# Patient Record
Sex: Male | Born: 1960 | Race: White | Hispanic: No | Marital: Married | Smoking: Current every day smoker
Health system: Southern US, Community
[De-identification: ages and names within clinical notes are randomized; demographics above are authoritative.]

## PROBLEM LIST (undated history)

## (undated) DIAGNOSIS — M549 Dorsalgia, unspecified: Secondary | ICD-10-CM

## (undated) DIAGNOSIS — J449 Chronic obstructive pulmonary disease, unspecified: Secondary | ICD-10-CM

## (undated) DIAGNOSIS — I10 Essential (primary) hypertension: Secondary | ICD-10-CM

## (undated) DIAGNOSIS — F419 Anxiety disorder, unspecified: Secondary | ICD-10-CM

## (undated) DIAGNOSIS — K219 Gastro-esophageal reflux disease without esophagitis: Secondary | ICD-10-CM

## (undated) HISTORY — DX: Chronic obstructive pulmonary disease, unspecified: J44.9

---

## 2013-04-26 ENCOUNTER — Encounter (HOSPITAL_COMMUNITY): Payer: Self-pay | Admitting: Emergency Medicine

## 2013-04-26 ENCOUNTER — Emergency Department (HOSPITAL_COMMUNITY)
Admission: EM | Admit: 2013-04-26 | Discharge: 2013-04-26 | Disposition: A | Discharge status: Home / Self Care | Payer: Medicaid Other | Attending: Emergency Medicine | Admitting: Emergency Medicine

## 2013-04-26 DIAGNOSIS — Z7982 Long term (current) use of aspirin: Secondary | ICD-10-CM | POA: Insufficient documentation

## 2013-04-26 DIAGNOSIS — F411 Generalized anxiety disorder: Secondary | ICD-10-CM | POA: Insufficient documentation

## 2013-04-26 DIAGNOSIS — F17200 Nicotine dependence, unspecified, uncomplicated: Secondary | ICD-10-CM | POA: Insufficient documentation

## 2013-04-26 DIAGNOSIS — K029 Dental caries, unspecified: Secondary | ICD-10-CM

## 2013-04-26 DIAGNOSIS — K219 Gastro-esophageal reflux disease without esophagitis: Secondary | ICD-10-CM | POA: Insufficient documentation

## 2013-04-26 DIAGNOSIS — M549 Dorsalgia, unspecified: Secondary | ICD-10-CM | POA: Insufficient documentation

## 2013-04-26 DIAGNOSIS — Z79899 Other long term (current) drug therapy: Secondary | ICD-10-CM | POA: Insufficient documentation

## 2013-04-26 DIAGNOSIS — I10 Essential (primary) hypertension: Secondary | ICD-10-CM | POA: Insufficient documentation

## 2013-04-26 HISTORY — DX: Dorsalgia, unspecified: M54.9

## 2013-04-26 HISTORY — DX: Essential (primary) hypertension: I10

## 2013-04-26 HISTORY — DX: Gastro-esophageal reflux disease without esophagitis: K21.9

## 2013-04-26 HISTORY — DX: Anxiety disorder, unspecified: F41.9

## 2013-04-26 MED ORDER — ACETAMINOPHEN 325 MG PO TABS
650.0000 mg | ORAL_TABLET | Freq: Once | ORAL | Status: AC
  Administered 2013-04-26: 650 mg via ORAL
  Filled 2013-04-26: qty 2

## 2013-04-26 MED ORDER — PENICILLIN V POTASSIUM 250 MG PO TABS
500.0000 mg | ORAL_TABLET | Freq: Once | ORAL | Status: AC
  Administered 2013-04-26: 500 mg via ORAL
  Filled 2013-04-26: qty 2

## 2013-04-26 MED ORDER — AMOXICILLIN 500 MG PO CAPS: ORAL_CAPSULE | ORAL | Status: DC

## 2013-04-26 MED ORDER — IBUPROFEN 800 MG PO TABS
800.0000 mg | ORAL_TABLET | Freq: Once | ORAL | Status: AC
  Administered 2013-04-26: 800 mg via ORAL
  Filled 2013-04-26: qty 1

## 2013-04-26 NOTE — ED Notes (Signed)
Pt believes he has an abscessed tooth to right upper jaw, right facial pain and swelling

## 2013-04-26 NOTE — ED Provider Notes (Signed)
CSN: [id number]     Arrival date & time 04/26/13  1300 History   First MD Initiated Contact with Patient 04/26/13 1425     Chief Complaint  Patient presents with  . Dental Pain   (Consider location/radiation/quality/duration/timing/severity/associated sxs/prior Treatment) Patient is a 52 y.o. male presenting with tooth pain. The history is provided by the patient.  Dental Pain Location:  Upper Quality:  Aching Severity:  Moderate Onset quality:  Gradual Duration:  2 days Timing:  Intermittent Progression:  Worsening Chronicity: new on chronic. Context: dental caries and poor dentition   Relieved by:  Nothing Ineffective treatments:  None tried Associated symptoms: facial pain   Associated symptoms: no fever, no neck pain and no neck swelling   Risk factors: smoking     Past Medical History  Diagnosis Date  . Hypertension   . Anxiety   . Back pain   . Acid reflux    History reviewed. No pertinent past surgical history. History reviewed. No pertinent family history. History  Substance Use Topics  . Smoking status: Current Every Day Smoker    Types: Cigarettes  . Smokeless tobacco: Not on file  . Alcohol Use: No    Review of Systems  Constitutional: Negative for fever and activity change.       All ROS Neg except as noted in HPI  HENT: Positive for dental problem. Negative for nosebleeds.   Eyes: Negative for photophobia and discharge.  Respiratory: Negative for cough, shortness of breath and wheezing.   Cardiovascular: Negative for chest pain and palpitations.  Gastrointestinal: Negative for abdominal pain and blood in stool.  Genitourinary: Negative for dysuria, frequency and hematuria.  Musculoskeletal: Positive for back pain. Negative for arthralgias and neck pain.  Skin: Negative.   Neurological: Negative for dizziness, seizures and speech difficulty.  Psychiatric/Behavioral: Negative for hallucinations and confusion. The patient is nervous/anxious.      Allergies  Lisinopril  Home Medications   Current Outpatient Rx  Name  Route  Sig  Dispense  Refill  . ALPRAZolam (XANAX) 1 MG tablet   Oral   Take 1 mg by mouth 4 (four) times daily as needed for anxiety.         Marland Kitchen aspirin EC 81 MG tablet   Oral   Take 81 mg by mouth daily.         Marland Kitchen doxycycline (VIBRA-TABS) 100 MG tablet   Oral   Take 100 mg by mouth daily.         Marland Kitchen HYDROcodone-acetaminophen (NORCO/VICODIN) 5-325 MG per tablet   Oral   Take 1 tablet by mouth 3 (three) times daily.         . metoprolol (LOPRESSOR) 50 MG tablet   Oral   Take 50 mg by mouth 2 (two) times daily.         Marland Kitchen omeprazole (PRILOSEC) 20 MG capsule   Oral   Take 20 mg by mouth daily.          BP 153/81  Pulse 87  Temp(Src) 97.9 F (36.6 C) (Oral)  Resp 20  Ht 5\' 11"  (1.803 m)  Wt 213 lb (96.616 kg)  BMI 29.72 kg/m2  SpO2 100% Physical Exam  Nursing note and vitals reviewed. Constitutional: He is oriented to person, place, and time. He appears well-developed and well-nourished.  Non-toxic appearance.  HENT:  Head: Normocephalic.  Right Ear: Tympanic membrane and external ear normal.  Left Ear: Tympanic membrane and external ear normal.  Patient is generalized advanced  dental decay of most of the teeth in his upper gum area. No visible abscess noted. There is swelling of the gum. The airway is patent. There is no swelling under the tongue.  No significant facial swelling or asymmetry appreciated.  Eyes: EOM and lids are normal. Pupils are equal, round, and reactive to light.  Neck: Normal range of motion. Neck supple. Carotid bruit is not present.  Cardiovascular: Normal rate, regular rhythm, normal heart sounds, intact distal pulses and normal pulses.   Pulmonary/Chest: Breath sounds normal. No respiratory distress.  Abdominal: Soft. Bowel sounds are normal. There is no tenderness. There is no guarding.  Musculoskeletal: Normal range of motion.  Lymphadenopathy:        Head (right side): No submandibular adenopathy present.       Head (left side): No submandibular adenopathy present.    He has no cervical adenopathy.  Neurological: He is alert and oriented to person, place, and time. He has normal strength. No cranial nerve deficit or sensory deficit.  Skin: Skin is warm and dry.  Psychiatric: He has a normal mood and affect. His speech is normal.    ED Course  Procedures (including critical care time) Labs Review Labs Reviewed - No data to display Imaging Review No results found.  EKG Interpretation   None       MDM  No diagnosis found. **I have reviewed nursing notes, vital signs, and all appropriate lab and imaging results for this patient.*  The patient has diffuse advanced dental decay of most of the teeth of his upper gum area. I cannot discern any significant facial swelling at this particular time. The plan at this time is for the patient be placed on Amoxil. The patient is to use ibuprofen and Tylenol for pain and discomfort. The patient is strongly advised to see a dentist as sone as possible for resolution of this problem.  Kathie Dike, PA-C 04/26/13 1527

## 2013-04-29 NOTE — ED Provider Notes (Signed)
Medical screening examination/treatment/procedure(s) were performed by non-physician practitioner and as supervising physician I was immediately available for consultation/collaboration.  EKG Interpretation   None          Valencia Kassa J. Mirah Nevins, MD 04/29/13 0727 

## 2014-03-05 ENCOUNTER — Encounter: Payer: Self-pay | Admitting: Emergency Medicine

## 2014-03-05 ENCOUNTER — Ambulatory Visit (INDEPENDENT_AMBULATORY_CARE_PROVIDER_SITE_OTHER): Payer: Medicaid Other | Admitting: Emergency Medicine

## 2014-03-05 ENCOUNTER — Ambulatory Visit (INDEPENDENT_AMBULATORY_CARE_PROVIDER_SITE_OTHER)
Admission: RE | Admit: 2014-03-05 | Discharge: 2014-03-05 | Disposition: A | Discharge status: Home / Self Care | Payer: Medicaid Other | Source: Ambulatory Visit | Attending: Emergency Medicine | Admitting: Emergency Medicine

## 2014-03-05 VITALS — BP 140/72 | HR 73 | Temp 97.7°F | Ht 71.0 in | Wt 222.8 lb

## 2014-03-05 DIAGNOSIS — J449 Chronic obstructive pulmonary disease, unspecified: Secondary | ICD-10-CM

## 2014-03-05 DIAGNOSIS — Z720 Tobacco use: Secondary | ICD-10-CM

## 2014-03-05 NOTE — Progress Notes (Signed)
Subjective:    Patient ID: Ian Armstrong, male    DOB: 08/12/1960, 53 y.o.   MRN: [id number]  HPI 53 yo man, active smoker (40+ pk-yrs), hx HTN, GERD, anxiety. Carries a dx of COPD made in 10/15, started on Spiriva at that time. He describes cough that bothers him most of the day, also at night. He hears wheeze at night, tries to cough to clear it but unable. He has SOB with exertion, walking uphill. He believes he was doing better until this Spring w pollen exposure. He has occasionally had chest soreness. He isn't sure that the spiriva has made anything any better. He still has a globus sensation. He gets some benefit from albuterol. Sputum is usually clear, sometimes some yellow. Never hemoptysis. Smoking a pack/day. He has quit before.    Review of Systems  Constitutional: Negative for fever and unexpected weight change.  HENT: Negative for congestion, dental problem, ear pain, nosebleeds, postnasal drip, rhinorrhea, sinus pressure, sneezing, sore throat and trouble swallowing.   Eyes: Negative for redness and itching.  Respiratory: Positive for cough and shortness of breath. Negative for chest tightness and wheezing.   Cardiovascular: Negative for palpitations and leg swelling.  Gastrointestinal: Negative for nausea and vomiting.  Genitourinary: Negative for dysuria.  Musculoskeletal: Negative for joint swelling.  Skin: Negative for rash.  Neurological: Negative for headaches.  Hematological: Does not bruise/bleed easily.  Psychiatric/Behavioral: Negative for dysphoric mood. The patient is not nervous/anxious.     Past Medical History  Diagnosis Date  . Hypertension   . Anxiety   . Back pain   . Acid reflux   . COPD (chronic obstructive pulmonary disease)      Family History  Problem Relation Age of Onset  . Diabetes Mother     deceased  . Heart attack Mother     deceased  . Emphysema Father     deceased  . Allergies Grandchild   . Asthma Grandchild   . Parkinson's  disease Maternal Grandfather      History   Social History  . Marital Status: Single    Spouse Name: N/A    Number of Children: 2  . Years of Education: N/A   Occupational History  . unemployed    Social History Main Topics  . Smoking status: Current Every Day Smoker -- 1.00 packs/day for 41 years    Types: Cigarettes  . Smokeless tobacco: Not on file  . Alcohol Use: No  . Drug Use: No  . Sexual Activity: Not on file   Other Topics Concern  . Not on file   Social History Narrative  Has worked Quarry managerroofing, asbestos, Holiday representativeconstruction.   Allergies  Allergen Reactions  . Lisinopril Other (See Comments)    Makes face tingle  . Other     Steroids - Dr. Luciana Axeankin told patient not to take unless life threatening situation     Outpatient Prescriptions Prior to Visit  Medication Sig Dispense Refill  . ALPRAZolam (XANAX) 1 MG tablet Take 1 mg by mouth 4 (four) times daily as needed for anxiety.    Marland Kitchen. aspirin EC 81 MG tablet Take 81 mg by mouth daily.    Marland Kitchen. HYDROcodone-acetaminophen (NORCO/VICODIN) 5-325 MG per tablet Take 1 tablet by mouth 3 (three) times daily.    . metoprolol (LOPRESSOR) 50 MG tablet Take 50 mg by mouth 2 (two) times daily.    Marland Kitchen. omeprazole (PRILOSEC) 20 MG capsule Take 20 mg by mouth daily.    .Marland Kitchen  amoxicillin (AMOXIL) 500 MG capsule 2 po bid with food 28 capsule 0  . doxycycline (VIBRA-TABS) 100 MG tablet Take 100 mg by mouth daily.     No facility-administered medications prior to visit.       Objective:   Physical Exam Filed Vitals:   03/05/14 1120  BP: 140/72  Pulse: 73  Temp: 97.7 F (36.5 C)  TempSrc: Oral  Height: 5\' 11"  (1.803 m)  Weight: 222 lb 12.8 oz (101.061 kg)  SpO2: 96%   Gen: Pleasant, well-nourished, in no distress,  normal affect  ENT: No lesions,  mouth clear,  oropharynx clear, no postnasal drip  Neck: No JVD, no TMG, no carotid bruits  Lungs: No use of accessory muscles, distant, clear without rales or rhonchi  Cardiovascular: RRR,  heart sounds normal, no murmur or gallops, no peripheral edema  Musculoskeletal: No deformities, no cyanosis or clubbing  Neuro: alert, non focal  Skin: Warm, no lesions or rashes  Assessment & Plan:  COPD (chronic obstructive pulmonary disease) Just started Spiriva. Remains symptomatic, particularly with some cough and UA irritation. ? Whether this could relate to the BD itself. Will need to consider adjusting the regimen. Needs PFT and a CXR    We will perform a CXR today We will arrange for full pulmonary function testing at your next visit Continue your spiriva daily Use albuterol 2 puffs as needed Follow with Dr Delton Coombes next available with full PFT

## 2014-03-05 NOTE — Assessment & Plan Note (Addendum)
Just started Spiriva. Remains symptomatic, particularly with some cough and UA irritation. ? Whether this could relate to the BD itself. Will need to consider adjusting the regimen. Needs PFT and a CXR    We will perform a CXR today We will arrange for full pulmonary function testing at your next visit Continue your spiriva daily Use albuterol 2 puffs as needed Follow with Dr Delton CoombesByrum next available with full PFT

## 2014-03-05 NOTE — Patient Instructions (Signed)
We will perform a CXR today We will arrange for full pulmonary function testing at your next visit Continue your spiriva daily Use albuterol 2 puffs as needed Follow with Dr Delton CoombesByrum next available with full PFT

## 2014-04-03 ENCOUNTER — Ambulatory Visit (INDEPENDENT_AMBULATORY_CARE_PROVIDER_SITE_OTHER): Payer: Medicaid Other | Admitting: Emergency Medicine

## 2014-04-03 ENCOUNTER — Encounter: Payer: Self-pay | Admitting: Emergency Medicine

## 2014-04-03 VITALS — BP 122/84 | HR 96 | Ht 71.0 in | Wt 224.0 lb

## 2014-04-03 DIAGNOSIS — J449 Chronic obstructive pulmonary disease, unspecified: Secondary | ICD-10-CM

## 2014-04-03 LAB — PULMONARY FUNCTION TEST
DL/VA % pred: 97 %
DL/VA: 4.59 ml/min/mmHg/L
DLCO unc % pred: 87 %
DLCO unc: 29.49 ml/min/mmHg
FEF 25-75 Post: 3.93 L/sec
FEF 25-75 Pre: 3.91 L/sec
FEF2575-%CHANGE-POST: 0 %
FEF2575-%Pred-Post: 118 %
FEF2575-%Pred-Pre: 117 %
FEV1-%CHANGE-POST: 0 %
FEV1-%PRED-POST: 88 %
FEV1-%PRED-PRE: 88 %
FEV1-PRE: 3.44 L
FEV1-Post: 3.45 L
FEV1FVC-%Change-Post: 0 %
FEV1FVC-%PRED-PRE: 107 %
FEV6-%Change-Post: 0 %
FEV6-%PRED-POST: 85 %
FEV6-%Pred-Pre: 85 %
FEV6-POST: 4.15 L
FEV6-Pre: 4.17 L
FEV6FVC-%PRED-PRE: 104 %
FEV6FVC-%Pred-Post: 104 %
FVC-%CHANGE-POST: 0 %
FVC-%PRED-PRE: 81 %
FVC-%Pred-Post: 81 %
FVC-POST: 4.16 L
FVC-Pre: 4.17 L
POST FEV1/FVC RATIO: 83 %
PRE FEV1/FVC RATIO: 83 %
Post FEV6/FVC ratio: 100 %
Pre FEV6/FVC Ratio: 100 %
RV % pred: 69 %
RV: 1.54 L
TLC % pred: 79 %
TLC: 5.68 L

## 2014-04-03 NOTE — Progress Notes (Signed)
Subjective:    Patient ID: Ian Armstrong, male    DOB: 01/17/1961, 53 y.o.   MRN: [id number]  HPI 53 yo man, active smoker (40+ pk-yrs), hx HTN, GERD, anxiety. Carries a dx of COPD made in 10/15, started on Spiriva at that time. He describes cough that bothers him most of the day, also at night. He hears wheeze at night, tries to cough to clear it but unable. He has SOB with exertion, walking uphill. He believes he was doing better until this Spring w pollen exposure. He has occasionally had chest soreness. He isn't sure that the spiriva has made anything any better. He still has a globus sensation. He gets some benefit from albuterol. Sputum is usually clear, sometimes some yellow. Never hemoptysis. Smoking a pack/day. He has quit before.   ROV 04/03/14 -- follow up for cough, suspected COPD +/- UA disease. Follows up today after PFT. He has been taking Spiriva reliably but unclear whether it has made any difference. He is having trouble especially at night, starts with cough and wheeze. His PPI was increased this am by his PCP. His PFT show normal airflows with some ? Restriction.    Review of Systems  Constitutional: Negative for fever and unexpected weight change.  HENT: Negative for congestion, dental problem, ear pain, nosebleeds, postnasal drip, rhinorrhea, sinus pressure, sneezing, sore throat and trouble swallowing.   Eyes: Negative for redness and itching.  Respiratory: Positive for cough and shortness of breath. Negative for chest tightness and wheezing.   Cardiovascular: Negative for palpitations and leg swelling.  Gastrointestinal: Negative for nausea and vomiting.  Genitourinary: Negative for dysuria.  Musculoskeletal: Negative for joint swelling.  Skin: Negative for rash.  Neurological: Negative for headaches.  Hematological: Does not bruise/bleed easily.  Psychiatric/Behavioral: Negative for dysphoric mood. The patient is not nervous/anxious.        Objective:   Physical  Exam Filed Vitals:   04/03/14 1625  BP: 122/84  Pulse: 96  Height: 5\' 11"  (1.803 m)  Weight: 224 lb (101.606 kg)  SpO2: 97%   Gen: Pleasant, well-nourished, in no distress,  normal affect  ENT: No lesions,  mouth clear,  oropharynx clear, no postnasal drip  Neck: No JVD, no TMG, no carotid bruits  Lungs: No use of accessory muscles, distant, clear without rales or rhonchi  Cardiovascular: RRR, heart sounds normal, no murmur or gallops, no peripheral edema  Musculoskeletal: No deformities, no cyanosis or clubbing  Neuro: alert, non focal  Skin: Warm, no lesions or rashes  Assessment & Plan:  COPD (chronic obstructive pulmonary disease) PFT testing surprisingly reassuring. Suspect that he does have a chronic bronchitic component is being influenced by his smoking and GERD  Your pulmonary function testing does not show any evidence for significant COPD Your cough and wheeze and mucus are probably being influenced by her smoking, acid reflux, rhinitis. You may also be having some irritation from the Spiriva. For this reason we will stop the Spiriva today Please continue to work on stopping smoking Follow with Dr Delton CoombesByrum in 6 months or sooner if you have any problems

## 2014-04-03 NOTE — Patient Instructions (Signed)
Your pulmonary function testing does not show any evidence for significant COPD Your cough and wheeze and mucus are probably being influenced by her smoking, acid reflux, rhinitis. You may also be having some irritation from the Spiriva. For this reason we will stop the Spiriva today Please continue to work on stopping smoking Follow with Dr Delton CoombesByrum in 6 months or sooner if you have any problems

## 2014-04-03 NOTE — Progress Notes (Signed)
PFT done today. 

## 2014-04-03 NOTE — Assessment & Plan Note (Signed)
PFT testing surprisingly reassuring. Suspect that he does have a chronic bronchitic component is being influenced by his smoking and GERD  Your pulmonary function testing does not show any evidence for significant COPD Your cough and wheeze and mucus are probably being influenced by her smoking, acid reflux, rhinitis. You may also be having some irritation from the Spiriva. For this reason we will stop the Spiriva today Please continue to work on stopping smoking Follow with Dr Delton CoombesByrum in 6 months or sooner if you have any problems

## 2015-09-25 ENCOUNTER — Encounter (INDEPENDENT_AMBULATORY_CARE_PROVIDER_SITE_OTHER): Payer: Self-pay | Admitting: *Deleted

## 2015-10-13 IMAGING — CR DG CHEST 2V
2 series · 2 of 2 positions shown · non-contrast
Comparison: None.

CLINICAL DATA: Cough which shortness of breath and wheezing.
Initial encounter.

EXAM:
CHEST  2 VIEW

[view not recorded (1 of 2)]
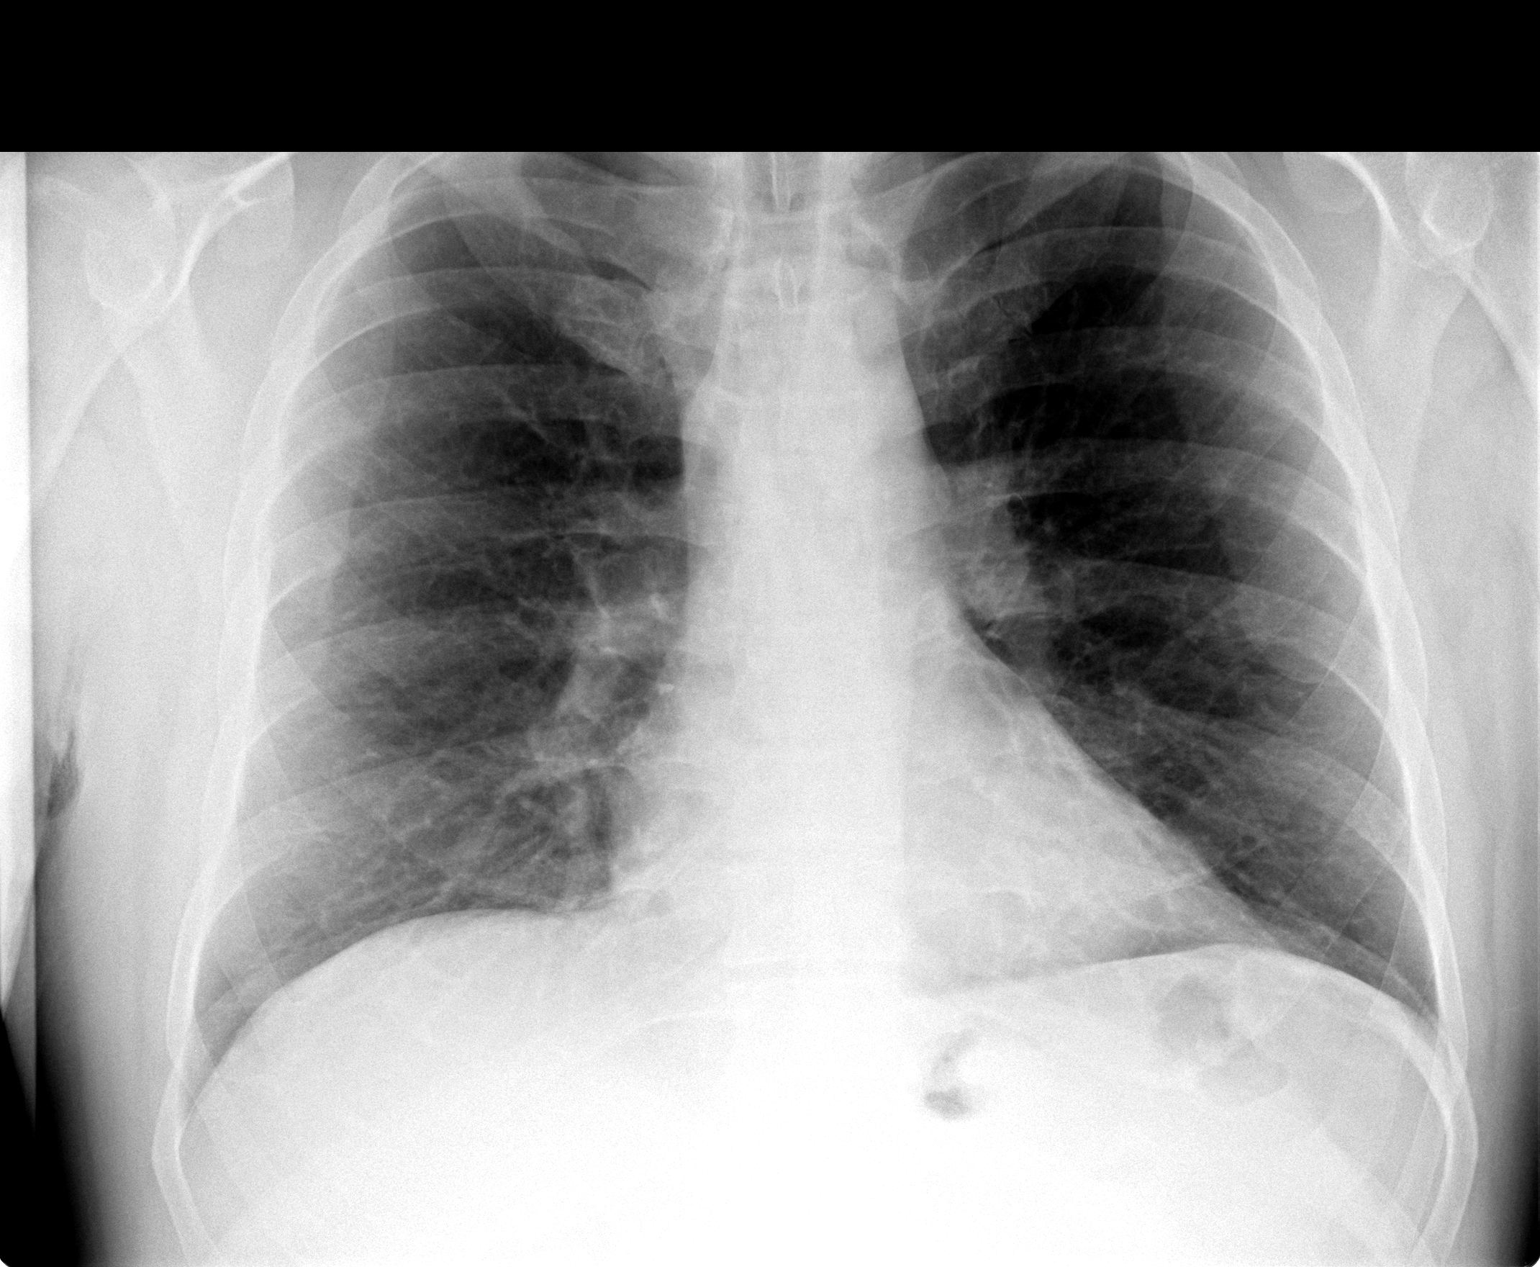

[view not recorded (2 of 2)]
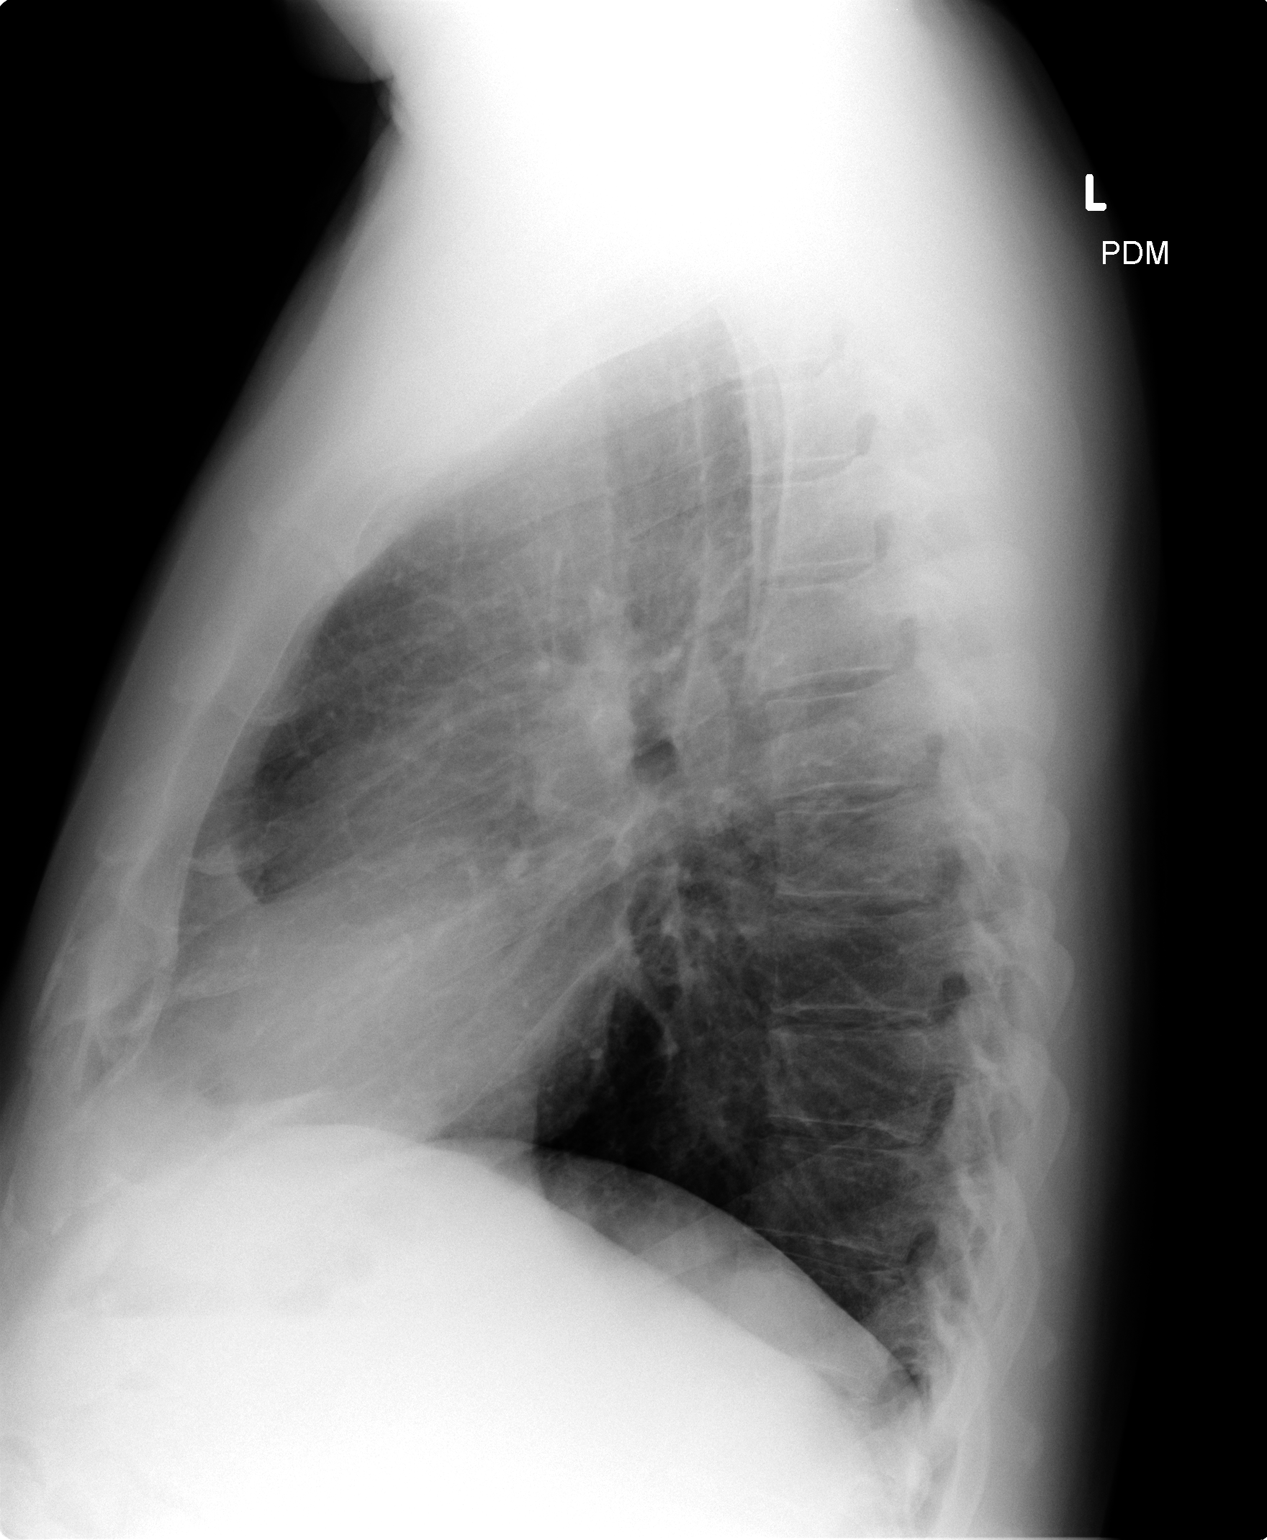

[2 of 2 positions shown; findings below may reference images not displayed]

FINDINGS: The heart size and mediastinal contours are within normal limits.
Both lungs are clear. The visualized skeletal structures are
unremarkable.
IMPRESSION: No active cardiopulmonary disease.

## 2016-01-31 ENCOUNTER — Other Ambulatory Visit: Payer: Self-pay | Admitting: Physician Assistant

## 2016-02-18 ENCOUNTER — Other Ambulatory Visit: Payer: Self-pay | Admitting: Physician Assistant

## 2016-03-07 ENCOUNTER — Telehealth: Payer: Self-pay | Admitting: Physician Assistant

## 2016-03-31 DIAGNOSIS — N485 Ulcer of penis: Secondary | ICD-10-CM | POA: Diagnosis not present

## 2016-03-31 DIAGNOSIS — I10 Essential (primary) hypertension: Secondary | ICD-10-CM | POA: Insufficient documentation

## 2016-03-31 DIAGNOSIS — F17210 Nicotine dependence, cigarettes, uncomplicated: Secondary | ICD-10-CM | POA: Insufficient documentation

## 2016-03-31 DIAGNOSIS — N4829 Other inflammatory disorders of penis: Secondary | ICD-10-CM | POA: Diagnosis present

## 2016-03-31 DIAGNOSIS — J449 Chronic obstructive pulmonary disease, unspecified: Secondary | ICD-10-CM | POA: Diagnosis not present

## 2016-03-31 DIAGNOSIS — Z79899 Other long term (current) drug therapy: Secondary | ICD-10-CM | POA: Diagnosis not present

## 2016-03-31 DIAGNOSIS — Z7982 Long term (current) use of aspirin: Secondary | ICD-10-CM | POA: Diagnosis not present

## 2016-03-31 NOTE — ED Triage Notes (Addendum)
Pt has a reddened area/ulcer on the proximal end of his penis. Pt states it started out as a white pimple on Tuesday. Pt also states it has pus draining from it.

## 2016-04-01 ENCOUNTER — Encounter (HOSPITAL_COMMUNITY): Payer: Self-pay | Admitting: *Deleted

## 2016-04-01 ENCOUNTER — Emergency Department (HOSPITAL_COMMUNITY)
Admission: EM | Admit: 2016-04-01 | Discharge: 2016-04-01 | Disposition: A | Discharge status: Home / Self Care | Payer: BLUE CROSS/BLUE SHIELD | Attending: Emergency Medicine | Admitting: Emergency Medicine

## 2016-04-01 DIAGNOSIS — N485 Ulcer of penis: Secondary | ICD-10-CM | POA: Diagnosis not present

## 2016-04-01 DIAGNOSIS — L98499 Non-pressure chronic ulcer of skin of other sites with unspecified severity: Secondary | ICD-10-CM

## 2016-04-01 MED ORDER — DOXYCYCLINE HYCLATE 100 MG PO CAPS: 100.0000 mg | ORAL_CAPSULE | Freq: Two times a day (BID) | ORAL | 0 refills | Status: AC

## 2016-04-01 MED ORDER — DOXYCYCLINE HYCLATE 100 MG PO TABS
100.0000 mg | ORAL_TABLET | Freq: Once | ORAL | Status: AC
  Administered 2016-04-01: 100 mg via ORAL
  Filled 2016-04-01: qty 1

## 2016-04-01 NOTE — Discharge Instructions (Signed)
Please soak in a tub of warm Epsom salt water daily for about 15-20 minutes until the infected area heals from the inside out. Please use doxycycline 2 times daily until all taken. Please wash hands frequently. Please refrain from sexual intercourse until the wound has healed. Use loosefitting underwear and clothing so that the infected area is not irritated. Please see Dr. Charm BargesButler for additional evaluation and management if not improving.

## 2016-04-01 NOTE — ED Provider Notes (Signed)
[facility]-EMER[facility]ENCY DEPT Provider Note   CSN: [id number] Arrival date & time: 03/31/16  2344     History   Chief Complaint Chief Complaint  Patient presents with  . Abscess    HPI Ian BrunnerRichard J Armstrong is a 55 y.o. male.  Patient is a 55 year old male who presents to the emergency department with complaint of an abscess of the penis.  The patient states that he noted a pimple on the penis. This was noted on Tuesday, November 28. On Wednesday, November 29 he decided to pop the pimple. He states that when he did this some pus material came out on. Today he noted that there was some discomfort and pain in the area and he felt as though maybe the pain might be going up into his abdomen. He states it was not a severe pain, but it was a pain that lead to him k now it was their. He also noted some swelling at the area with redness. No fever or chills reported. Patient denies any medical problems that would interfere with his immune system.       Past Medical History:  Diagnosis Date  . Acid reflux   . Anxiety   . Back pain   . COPD (chronic obstructive pulmonary disease) (HCC)   . Hypertension     Patient Active Problem List   Diagnosis Date Noted  . COPD (chronic obstructive pulmonary disease) (HCC) 03/05/2014  . Tobacco abuse 03/05/2014    History reviewed. No pertinent surgical history.     Home Medications    Prior to Admission medications   Medication Sig Start Date End Date Taking? Authorizing Provider  ALPRAZolam Prudy Feeler(XANAX) 1 MG tablet Take 1 mg by mouth 4 (four) times daily as needed for anxiety.    Historical Provider, MD  aspirin EC 81 MG tablet Take 81 mg by mouth daily.    Historical Provider, MD  doxycycline (VIBRA-TABS) 100 MG tablet Take 100 mg by mouth 2 (two) times daily.    Historical Provider, MD  HYDROcodone-acetaminophen (NORCO/VICODIN) 5-325 MG per tablet Take 1 tablet by mouth 4 (four) times daily.     Historical Provider, MD  metoprolol (LOPRESSOR) 50 MG  tablet Take 50 mg by mouth 2 (two) times daily.    Historical Provider, MD  omeprazole (PRILOSEC) 20 MG capsule Take 20 mg by mouth 2 (two) times daily before a meal.     Historical Provider, MD    Family History Family History  Problem Relation Age of Onset  . Diabetes Mother     deceased  . Heart attack Mother     deceased  . Emphysema Father     deceased  . Allergies Grandchild   . Asthma Grandchild   . Parkinson's disease Maternal Grandfather     Social History Social History  Substance Use Topics  . Smoking status: Current Every Day Smoker    Packs/day: 1.00    Years: 41.00    Types: Cigarettes  . Smokeless tobacco: Never Used  . Alcohol use No     Allergies   Lisinopril and Other   Review of Systems Review of Systems  Musculoskeletal: Positive for back pain.  Skin: Positive for wound.       Ulcer of the penis  Psychiatric/Behavioral: The patient is nervous/anxious.   All other systems reviewed and are negative.    Physical Exam Updated Vital Signs BP 116/71 (BP Location: Left Arm)   Pulse 71   Temp 98.1 F (36.7 C) (Oral)  Resp 18   SpO2 99%   Physical Exam  Constitutional: He is oriented to person, place, and time. He appears well-developed and well-nourished.  Non-toxic appearance.  HENT:  Head: Normocephalic.  Right Ear: Tympanic membrane and external ear normal.  Left Ear: Tympanic membrane and external ear normal.  Eyes: EOM and lids are normal. Pupils are equal, round, and reactive to light.  Neck: Normal range of motion. Neck supple. Carotid bruit is not present.  Cardiovascular: Normal rate, regular rhythm, normal heart sounds, intact distal pulses and normal pulses.   Pulmonary/Chest: Breath sounds normal. No respiratory distress.  Abdominal: Soft. Bowel sounds are normal. There is no tenderness. There is no guarding.  Genitourinary:  Genitourinary Comments: Patient's significant other and male nurse in the room during the  examination.  No palpable inguinal nodes appreciated. There is a small ulcer at the upper shaft of the penis. There no red streaks appreciated. There is some purulent drainage present. The patient is uncircumcised. There no lesions at the glans of the penis. There no lesions of the scrotum. No testicular tenderness appreciated. No peritoneal tenderness.  Musculoskeletal: Normal range of motion.  Lymphadenopathy:       Head (right side): No submandibular adenopathy present.       Head (left side): No submandibular adenopathy present.    He has no cervical adenopathy.  Neurological: He is alert and oriented to person, place, and time. He has normal strength. No cranial nerve deficit or sensory deficit.  Skin: Skin is warm and dry.  Psychiatric: He has a normal mood and affect. His speech is normal.  Nursing note and vitals reviewed.    ED Treatments / Results  Labs (all labs ordered are listed, but only abnormal results are displayed) Labs Reviewed  RPR  HIV ANTIBODY (ROUTINE TESTING)    EKG  EKG Interpretation None       Radiology No results found.  Procedures Procedures (including critical care time)  Medications Ordered in ED Medications  doxycycline (VIBRA-TABS) tablet 100 mg (not administered)     Initial Impression / Assessment and Plan / ED Course  I have reviewed the triage vital signs and the nursing notes.  Pertinent labs & imaging results that were available during my care of the patient were reviewed by me and considered in my medical decision making (see chart for details).  Clinical Course     **I have reviewed nursing notes, vital signs, and all appropriate lab and imaging results for this patient.*  Final Clinical Impressions(s) / ED Diagnoses  Patient has an ulcer of the upper shaft of the penis on. There is no evidence for cellulitis. There is no testicular involvement or scrotal involvement. The patient will be tested for syphilis. Patient will  be started on doxycycline 2 tablets daily. I've also asked the patient to use warm tub soaks until the ulcer has healed from the inside out. The patient is to see his primary physician or return to the emergency department if not improving.    Final diagnoses:  None    New Prescriptions New Prescriptions   No medications on file     Ivery QualeHobson Sari Cogan, PA-C 04/01/16 0118    Devoria AlbeIva Knapp, MD 04/01/16 [phone]

## 2016-04-02 LAB — HIV ANTIBODY (ROUTINE TESTING W REFLEX): HIV Screen 4th Generation wRfx: NONREACTIVE

## 2016-04-03 LAB — RPR, QUANT+TP ABS (REFLEX)
Rapid Plasma Reagin, Quant: 1:1 {titer} — ABNORMAL HIGH
TREPONEMA PALLIDUM AB: NEGATIVE

## 2016-04-03 LAB — AEROBIC CULTURE W GRAM STAIN (SUPERFICIAL SPECIMEN): Special Requests: NORMAL

## 2016-04-03 LAB — AEROBIC CULTURE  (SUPERFICIAL SPECIMEN)

## 2016-04-03 LAB — RPR: RPR: REACTIVE — AB

## 2016-04-06 ENCOUNTER — Encounter (HOSPITAL_COMMUNITY): Payer: Self-pay | Admitting: Emergency Medicine

## 2016-04-06 ENCOUNTER — Emergency Department (HOSPITAL_COMMUNITY)
Admission: EM | Admit: 2016-04-06 | Discharge: 2016-04-06 | Disposition: A | Discharge status: Home / Self Care | Payer: BLUE CROSS/BLUE SHIELD | Attending: Emergency Medicine | Admitting: Emergency Medicine

## 2016-04-06 DIAGNOSIS — A539 Syphilis, unspecified: Secondary | ICD-10-CM

## 2016-04-06 DIAGNOSIS — I10 Essential (primary) hypertension: Secondary | ICD-10-CM | POA: Insufficient documentation

## 2016-04-06 DIAGNOSIS — J449 Chronic obstructive pulmonary disease, unspecified: Secondary | ICD-10-CM | POA: Diagnosis not present

## 2016-04-06 DIAGNOSIS — Z7982 Long term (current) use of aspirin: Secondary | ICD-10-CM | POA: Diagnosis not present

## 2016-04-06 DIAGNOSIS — Z79899 Other long term (current) drug therapy: Secondary | ICD-10-CM | POA: Insufficient documentation

## 2016-04-06 DIAGNOSIS — F17210 Nicotine dependence, cigarettes, uncomplicated: Secondary | ICD-10-CM | POA: Diagnosis not present

## 2016-04-06 MED ORDER — PENICILLIN G BENZATHINE 1200000 UNIT/2ML IM SUSP
1.2000 10*6.[IU] | Freq: Once | INTRAMUSCULAR | Status: AC
  Administered 2016-04-06: 1.2 10*6.[IU] via INTRAMUSCULAR
  Filled 2016-04-06: qty 2

## 2016-04-06 NOTE — ED Triage Notes (Signed)
Tested Thursday and called received to come back to ED for STD.

## 2016-04-06 NOTE — Discharge Instructions (Signed)
Finish the doxycycline you were prescribed at your last visit.  As discussed, since we do not know how long ago you contracted syphilis, you will need 2 more doses of penicillin , each one week apart.  Call your doctor for this.  If they are unable to provide this, the health department should be able to - if needed, however, return here for the remainder of your treatments.

## 2016-04-06 NOTE — ED Notes (Signed)
Pt alert & oriented x4, stable gait. Patient given discharge instructions, paperwork & prescription(s). Patient  instructed to stop at the registration desk to finish any additional paperwork. Patient verbalized understanding. Pt left department w/ no further questions. 

## 2016-04-06 NOTE — ED Notes (Signed)
Pt states was seen here & treated for abscess on Thursday. Was called today stating he was positive for syphilis & return to ER.  .Marland Kitchen

## 2016-04-08 NOTE — ED Provider Notes (Signed)
[facility] DEPT Provider Note   CSN: [id number] Arrival date & time: 04/06/16  1242     History   Chief Complaint Chief Complaint  Patient presents with  . SEXUALLY TRANSMITTED DISEASE    HPI Ian Armstrong is a 55 y.o. male presenting for treatment of syphilis.  He was seen here 5 days ago and treated for an abscess on his penis which he states is improved, and is still completing a course of doxycycline for this problem.  He was notified that his syphilis test is positive and will need treatment. Except for the improved penile infection, he is without complaint.  He has had no partners for the past 4 months except his new wife who is also here for treatment.  The history is provided by the patient.    Past Medical History:  Diagnosis Date  . Acid reflux   . Anxiety   . Back pain   . COPD (chronic obstructive pulmonary disease) (HCC)   . Hypertension     Patient Active Problem List   Diagnosis Date Noted  . COPD (chronic obstructive pulmonary disease) (HCC) 03/05/2014  . Tobacco abuse 03/05/2014    History reviewed. No pertinent surgical history.     Home Medications    Prior to Admission medications   Medication Sig Start Date End Date Taking? Authorizing Provider  ALPRAZolam Prudy Feeler(XANAX) 1 MG tablet Take 1 mg by mouth 4 (four) times daily as needed for anxiety.   Yes Historical Provider, MD  aspirin EC 81 MG tablet Take 81 mg by mouth daily.   Yes Historical Provider, MD  doxycycline (VIBRAMYCIN) 100 MG capsule Take 1 capsule (100 mg total) by mouth 2 (two) times daily. 04/01/16  Yes Ivery QualeHobson Bryant, PA-C  HYDROcodone-acetaminophen (NORCO/VICODIN) 5-325 MG per tablet Take 1 tablet by mouth 4 (four) times daily.    Yes Historical Provider, MD  metoprolol (LOPRESSOR) 50 MG tablet Take 50 mg by mouth 2 (two) times daily.   Yes Historical Provider, MD  omeprazole (PRILOSEC) 20 MG capsule Take 20 mg by mouth 2 (two) times daily before a meal.    Yes Historical Provider,  MD    Family History Family History  Problem Relation Age of Onset  . Diabetes Mother     deceased  . Heart attack Mother     deceased  . Emphysema Father     deceased  . Allergies Grandchild   . Asthma Grandchild   . Parkinson's disease Maternal Grandfather     Social History Social History  Substance Use Topics  . Smoking status: Current Every Day Smoker    Packs/day: 1.00    Years: 41.00    Types: Cigarettes  . Smokeless tobacco: Never Used  . Alcohol use No     Allergies   Lisinopril and Other   Review of Systems Review of Systems  Constitutional: Negative for fever.  HENT: Negative for congestion and sore throat.   Eyes: Negative.   Respiratory: Negative for chest tightness and shortness of breath.   Cardiovascular: Negative for chest pain.  Gastrointestinal: Negative for abdominal pain and nausea.  Genitourinary: Positive for genital sores. Negative for discharge.  Musculoskeletal: Negative for arthralgias, joint swelling and neck pain.  Skin: Negative.  Negative for rash and wound.  Neurological: Negative for dizziness, weakness, light-headedness, numbness and headaches.  Psychiatric/Behavioral: Negative.      Physical Exam Updated Vital Signs BP 149/64 (BP Location: Left Arm)   Pulse 79   Temp 98 F (36.7  C)   Resp 16   Ht 5\' 11"  (1.803 m)   Wt 83.9 kg   SpO2 100%   BMI 25.80 kg/m   Physical Exam  Constitutional: He appears well-developed and well-nourished.  HENT:  Head: Normocephalic and atraumatic.  Eyes: Conjunctivae are normal.  Neck: Normal range of motion.  Cardiovascular: Normal rate, regular rhythm, normal heart sounds and intact distal pulses.   Pulmonary/Chest: Effort normal and breath sounds normal. He has no wheezes.  Abdominal: Soft. Bowel sounds are normal. There is no tenderness.  Musculoskeletal: Normal range of motion.  Neurological: He is alert.  Skin: Skin is warm and dry.  Psychiatric: He has a normal mood and  affect.  Nursing note and vitals reviewed.    ED Treatments / Results  Labs (all labs ordered are listed, but only abnormal results are displayed) Labs Reviewed - No data to display  EKG  EKG Interpretation None       Radiology No results found.  Procedures Procedures (including critical care time)  Medications Ordered in ED Medications  penicillin g benzathine (BICILLIN LA) 1200000 UNIT/2ML injection 1.2 Million Units (1.2 Million Units Intramuscular Given 04/06/16 1409)  penicillin g benzathine (BICILLIN LA) 1200000 UNIT/2ML injection 1.2 Million Units (1.2 Million Units Intramuscular Given 04/06/16 1506)     Initial Impression / Assessment and Plan / ED Course  I have reviewed the triage vital signs and the nursing notes.  Pertinent labs & imaging results that were available during my care of the patient were reviewed by me and considered in my medical decision making (see chart for details).  Clinical Course     Pt tx with bicillin 2.4 mil units.  Advised 2 more doses weekly since the exposure timeline is unclear.    Pcp or health dept for further tx returning here if unable to get treated by these other sources.  Final Clinical Impressions(s) / ED Diagnoses   Final diagnoses:  Syphilis    New Prescriptions Discharge Medication List as of 04/06/2016  3:29 PM       Burgess AmorJulie Vada Swift, PA-C 04/08/16 1501    Marily MemosJason Mesner, MD 04/13/16 [phone]

## 2016-05-18 ENCOUNTER — Other Ambulatory Visit: Payer: Self-pay | Admitting: Physician Assistant

## 2016-09-02 ENCOUNTER — Other Ambulatory Visit: Payer: Self-pay | Admitting: Physician Assistant

## 2016-09-06 ENCOUNTER — Telehealth: Payer: Self-pay

## 2016-09-06 NOTE — Telephone Encounter (Signed)
Not a patient here. I do not see him. Followed by Charm BargesButler.

## 2019-12-16 ENCOUNTER — Other Ambulatory Visit (HOSPITAL_COMMUNITY): Payer: Self-pay | Admitting: Neurology

## 2019-12-16 DIAGNOSIS — M545 Low back pain, unspecified: Secondary | ICD-10-CM

## 2019-12-16 DIAGNOSIS — M25511 Pain in right shoulder: Secondary | ICD-10-CM
